# Patient Record
Sex: Male | Born: 1975 | Race: White | Hispanic: No | Marital: Married | Smoking: Never smoker
Health system: Southern US, Community
[De-identification: ages and names within clinical notes are randomized; demographics above are authoritative.]

## PROBLEM LIST (undated history)

## (undated) DIAGNOSIS — N289 Disorder of kidney and ureter, unspecified: Secondary | ICD-10-CM

---

## 2009-11-06 HISTORY — PX: KIDNEY TRANSPLANT: SHX239

## 2019-08-18 ENCOUNTER — Other Ambulatory Visit: Payer: Self-pay

## 2019-08-18 ENCOUNTER — Encounter (HOSPITAL_COMMUNITY): Payer: Self-pay | Admitting: Emergency Medicine

## 2019-08-18 ENCOUNTER — Emergency Department (HOSPITAL_COMMUNITY)
Admission: EM | Admit: 2019-08-18 | Discharge: 2019-08-19 | Disposition: A | Discharge status: Home / Self Care | Payer: BC Managed Care – PPO | Attending: Emergency Medicine | Admitting: Emergency Medicine

## 2019-08-18 ENCOUNTER — Emergency Department (HOSPITAL_COMMUNITY): Payer: BC Managed Care – PPO

## 2019-08-18 DIAGNOSIS — M25561 Pain in right knee: Secondary | ICD-10-CM

## 2019-08-18 DIAGNOSIS — M25461 Effusion, right knee: Secondary | ICD-10-CM

## 2019-08-18 HISTORY — DX: Disorder of kidney and ureter, unspecified: N28.9

## 2019-08-18 NOTE — ED Triage Notes (Signed)
Pt c/o right knee pain that started last night. Denies fall/trauma. Pt states he was seen for gout to the right foot by PCP yesterday.

## 2019-08-19 MED ORDER — HYDROCODONE-ACETAMINOPHEN 5-325 MG PO TABS: 1.0000 | ORAL_TABLET | Freq: Four times a day (QID) | ORAL | 0 refills | Status: AC | PRN

## 2019-08-19 MED ORDER — DICLOFENAC SODIUM 1 % TD GEL: 4.0000 g | Freq: Four times a day (QID) | TRANSDERMAL | 1 refills | Status: AC

## 2019-08-19 NOTE — ED Notes (Signed)
PT alert and oriented, one touch pt, see provider's assessment.

## 2019-08-19 NOTE — Discharge Instructions (Addendum)
You have been seen today for knee pain and swelling. There was swelling and fluid noted on the xray.  Diclofenac gel: This is a topical anti-inflammatory medication and can be applied directly to the painful region.  Do not use on the face or genitals.  This medication may be used as an alternative to oral anti-inflammatory medications, such as ibuprofen or naproxen. Acetaminophen: May take acetaminophen (generic for Tylenol), as needed, for pain. Your daily total maximum amount of acetaminophen from all sources should be limited to 4000mg /day for persons without liver problems, or 2000mg /day for those with liver problems. Vicodin: May take Vicodin (hydrocodone-acetaminophen) as needed for severe pain.   Do not drive or perform other dangerous activities while taking this medication as it can cause drowsiness as well as changes in reaction time and judgement.   Please note that each pill of Vicodin contains 325 mg of acetaminophen (generic for Tylenol) and the above dosage limits apply. Ice: May apply ice to the area over the next 24 hours for 15 minutes at a time to reduce swelling. Elevation: Keep the extremity elevated as often as possible to reduce pain and inflammation. Support: Wear the knee sleeve for support and comfort. Wear this until pain resolves. You will be weight-bearing as tolerated, which means you can slowly start to put weight on the extremity and increase amount and frequency as pain allows. Follow up: If symptoms are improving, you may follow up with your primary care provider for any continued management. If symptoms are not starting to improve within a week, you should follow up with the orthopedic specialist within two weeks. Return: Return to the ED for fever, numbness, weakness, increasing pain, overall worsening symptoms, loss of function, or if symptoms are not improving, you have tried to follow up with the orthopedic specialist, and have been unable to do so.  For  prescription assistance, may try using prescription discount sites or apps, such as goodrx.com

## 2019-08-19 NOTE — ED Provider Notes (Signed)
[facility] Provider Note   CSN: [id number] Arrival date & time: 08/18/19  1918     History   Chief Complaint Chief Complaint  Patient presents with  . Knee Pain    HPI Marc Mclaughlin is a 43 y.o. male.     HPI   Marc Mclaughlin is a 43 y.o. male, with a history of kidney transplant, presenting to the ED with right knee pain and swelling beginning evening of October 11.  Patient states he was sitting in a recliner throughout the day.  When he got up to move around, he noted pain had developed in the right knee associated with swelling.  He has not had this issue before. Patient states he was seen by his PCP last week for pain in the left toes, clinically diagnosed with gout, and prescribed colchicine.  No previous history of gout before this. Denies history of any recent procedures and has never had a procedure to the right knee. Denies fever/chills, color change, falls/trauma, numbness, weakness, calf pain/edema, shortness of breath, chest pain, or any other complaints.     Past Medical History:  Diagnosis Date  . Renal disorder     There are no active problems to display for this patient.   Past Surgical History:  Procedure Laterality Date  . KIDNEY TRANSPLANT  2011        Home Medications    Prior to Admission medications   Medication Sig Start Date End Date Taking? Authorizing Provider  diclofenac sodium (VOLTAREN) 1 % GEL Apply 4 g topically 4 (four) times daily. 08/19/19   Lucero Auzenne C, PA-C  HYDROcodone-acetaminophen (NORCO/VICODIN) 5-325 MG tablet Take 1-2 tablets by mouth every 6 (six) hours as needed for severe pain. 08/19/19   Clyde Zarrella, Hillard DankerShawn C, PA-C    Family History No family history on file.  Social History Social History   Tobacco Use  . Smoking status: Never Smoker  . Smokeless tobacco: Never Used  Substance Use Topics  . Alcohol use: Yes  . Drug use: Never     Allergies   Sulfa antibiotics and  Sulfamethoxazole   Review of Systems Review of Systems  Constitutional: Negative for fever.  Respiratory: Negative for shortness of breath.   Cardiovascular: Negative for chest pain.  Musculoskeletal: Positive for arthralgias and joint swelling.  Neurological: Negative for weakness and numbness.     Physical Exam Updated Vital Signs BP 138/84 (BP Location: Right Arm)   Pulse 88   Temp 99.2 F (37.3 C) (Oral)   Resp 18   SpO2 100%   Physical Exam Vitals signs and nursing note reviewed.  Constitutional:      General: He is not in acute distress.    Appearance: He is well-developed. He is not diaphoretic.  HENT:     Head: Normocephalic and atraumatic.  Eyes:     Conjunctiva/sclera: Conjunctivae normal.  Neck:     Musculoskeletal: Neck supple.  Cardiovascular:     Rate and Rhythm: Normal rate and regular rhythm.     Pulses:          Dorsalis pedis pulses are 2+ on the right side and 2+ on the left side.       Posterior tibial pulses are 2+ on the right side and 2+ on the left side.  Pulmonary:     Effort: Pulmonary effort is normal.  Musculoskeletal:     Comments: Swelling suggesting effusion that seems to be centered in the right suprapatellar region.  There is associated tenderness suprapatellar and medial. Patient is able to demonstrate active range of motion, though it is limited at about 90 degrees of flexion with patient stating he is limited due to the swelling in his knee. No increased warmth, erythema/color change, instability, laxity, or deformity noted in the right knee.  No tenderness, swelling, color change, or increased warmth to the right upper leg, posterior knee, calf, ankle, or foot.    Skin:    General: Skin is warm and dry.     Coloration: Skin is not pale.  Neurological:     Mental Status: He is alert.     Comments: Sensation to light touch grossly intact in the right lower extremity. Strength 5/5 in the right foot, knee, and hip.  Psychiatric:         Behavior: Behavior normal.      ED Treatments / Results  Labs (all labs ordered are listed, but only abnormal results are displayed) Labs Reviewed - No data to display  EKG None  Radiology Dg Knee Complete 4 Views Right  Result Date: 08/18/2019 CLINICAL DATA:  Right anterior patellar pain. EXAM: RIGHT KNEE - COMPLETE 4+ VIEW COMPARISON:  None. FINDINGS: No evidence of fracture, or dislocation. Large suprapatellar joint effusion. No evidence of arthropathy or other focal bone abnormality. Soft tissues are unremarkable. IMPRESSION: 1. Large suprapatellar joint effusion. 2. No radiographically apparent osseous abnormality. Electronically Signed   By: Ted Mcalpine M.D.   On: 08/18/2019 20:08    Procedures Procedures (including critical care time)  Medications Ordered in ED Medications - No data to display   Initial Impression / Assessment and Plan / ED Course  I have reviewed the triage vital signs and the nursing notes.  Pertinent labs & imaging results that were available during my care of the patient were reviewed by me and considered in my medical decision making (see chart for details).        Patient presents with knee pain and swelling. Patient is nontoxic appearing, afebrile, not tachycardic, not tachypneic, not hypotensive, maintains excellent SPO2 on room air.  His initial vital signs indicated some mild tachycardia, however, this resolved by the time I saw the patient.  I suspect the initial tachycardia may have been due to pain. X-ray shows suprapatellar effusion.  My suspicion for septic joint is low based on timeline and physical exam and vital signs are not suggestive. Discussed therapeutic arthrocentesis.  Patient declined. The patient was given instructions for home care as well as return precautions. Patient voices understanding of these instructions, accepts the plan, and is comfortable with discharge.   Findings and plan of care discussed with  Dione Booze, MD.    Vitals:   08/18/19 1927 08/18/19 1930 08/18/19 2311 08/19/19 0148  BP: (!) 145/71  138/84 (!) 157/86  Pulse: (!) 106  88 93  Resp: 18  18 14   Temp:  98.6 F (37 C) 99.2 F (37.3 C)   TempSrc:  Oral Oral   SpO2: 98%  100% 97%     Final Clinical Impressions(s) / ED Diagnoses   Final diagnoses:  Effusion of right knee  Acute pain of right knee    ED Discharge Orders         Ordered    HYDROcodone-acetaminophen (NORCO/VICODIN) 5-325 MG tablet  Every 6 hours PRN     08/19/19 0107    diclofenac sodium (VOLTAREN) 1 % GEL  4 times daily     08/19/19 0109  Lorayne Bender, PA-C 88/91/69 [id number]    Delora Fuel, MD 88/82/80 [phone]

## 2019-09-17 ENCOUNTER — Other Ambulatory Visit: Payer: Self-pay | Admitting: Orthopedic Surgery

## 2019-09-17 DIAGNOSIS — M25561 Pain in right knee: Secondary | ICD-10-CM

## 2019-10-09 ENCOUNTER — Other Ambulatory Visit: Payer: Self-pay

## 2019-10-09 ENCOUNTER — Ambulatory Visit
Admission: RE | Admit: 2019-10-09 | Discharge: 2019-10-09 | Disposition: A | Discharge status: Home / Self Care | Payer: BC Managed Care – PPO | Source: Ambulatory Visit | Attending: Orthopedic Surgery | Admitting: Orthopedic Surgery

## 2019-10-09 DIAGNOSIS — M25561 Pain in right knee: Secondary | ICD-10-CM

## 2021-09-26 IMAGING — MR MR KNEE*R* W/O CM
4 of 6 series · 21 of 40 positions shown · non-contrast
Comparison: Radiographs 08/18/2019.

CLINICAL DATA: Right knee pain and swelling for a few weeks. Some
weakness. No acute injury or prior relevant surgery.

EXAM:
MRI OF THE RIGHT KNEE WITHOUT CONTRAST
TECHNIQUE: Multiplanar, multisequence MR imaging of the knee was performed. No
intravenous contrast was administered.

[Series 3: T2 fat-sat · axial · 4.0mm · 0.50mm/px · z∈[-48,+71]mm · 3 of 30 slices shown (1 of 2)]
[im 6/30]
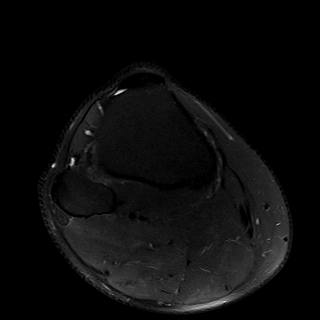
[im 18/30]
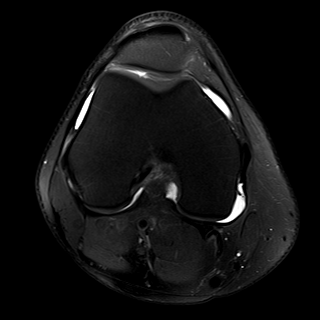
[im 30/30]
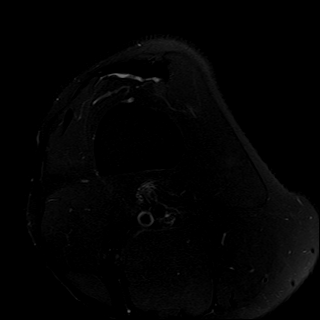

[Series 5: T2 fat-sat · coronal · 4.0mm · 0.31mm/px · 3 of 28 slices shown (2 of 2)]
[im 6/28]
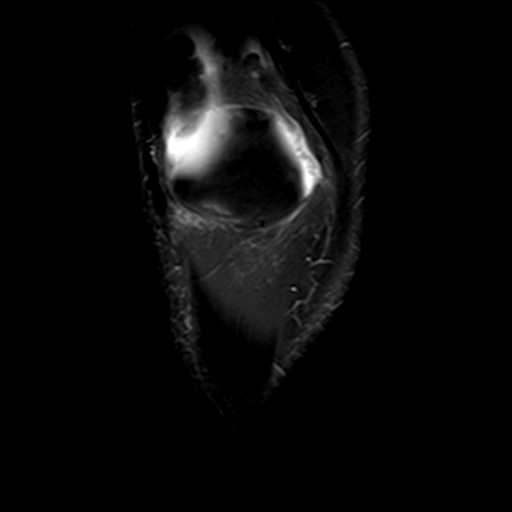
[im 17/28]
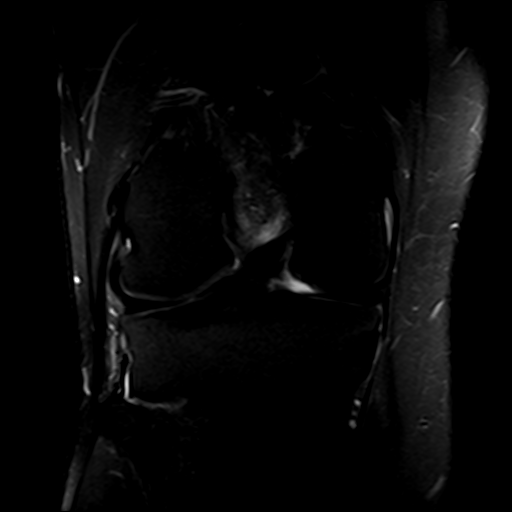
[im 28/28]
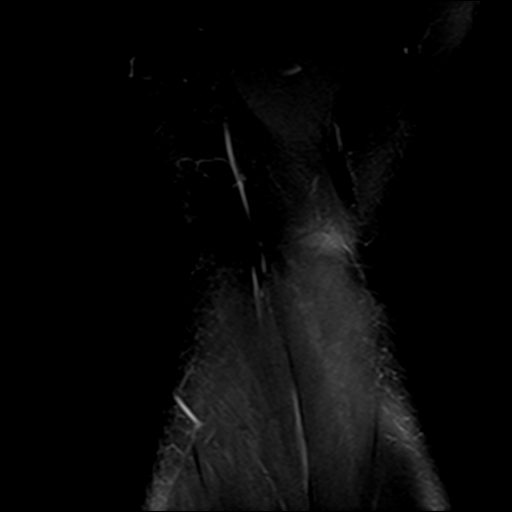

[Series 6: PD fat-sat · coronal · 3.0mm · 0.31mm/px · 8 of 35 slices shown (1 of 2)]
[im 1/35]
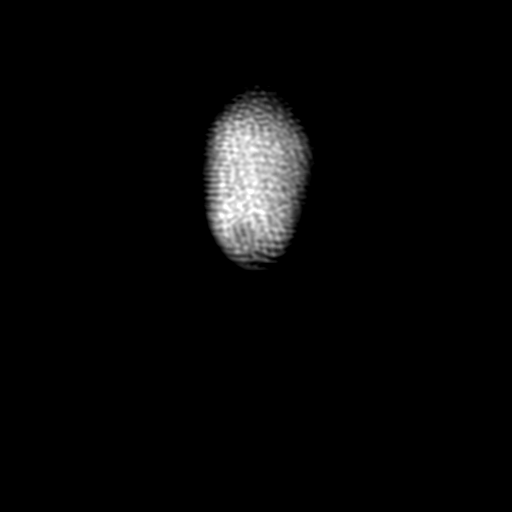
[im 5/35]
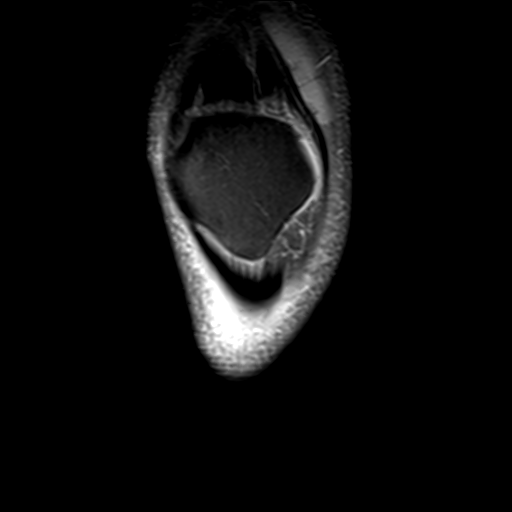
[im 10/35]
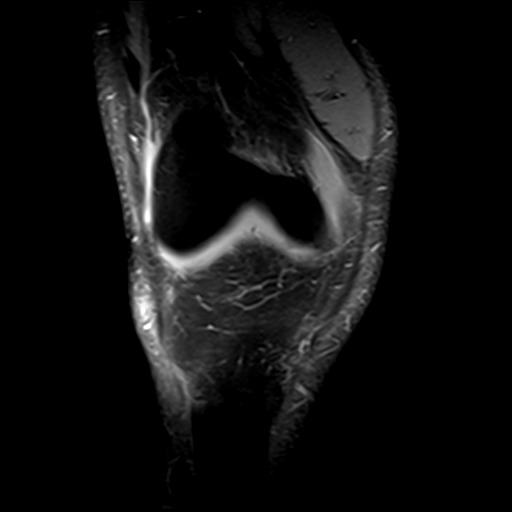
[im 15/35]
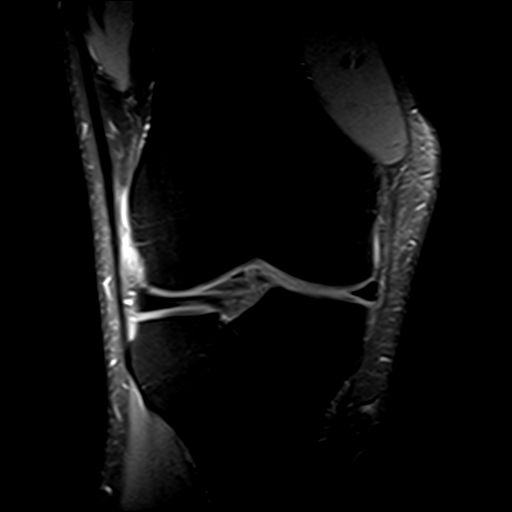
[im 20/35]
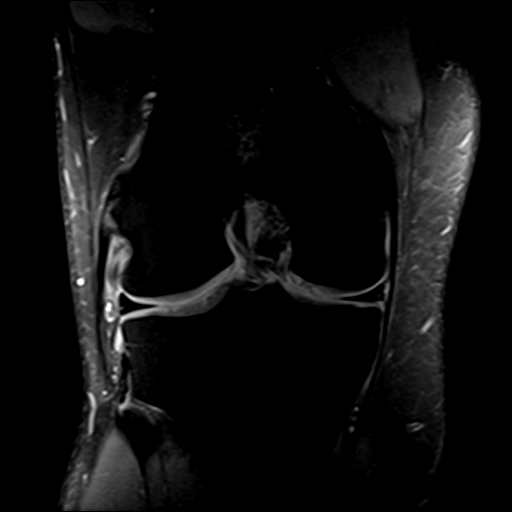
[im 25/35]
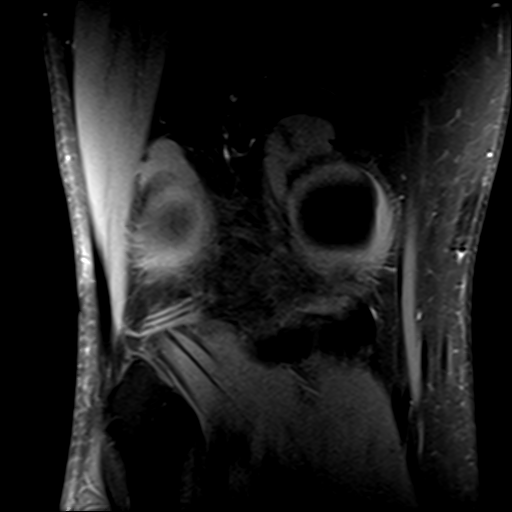
[im 30/35]
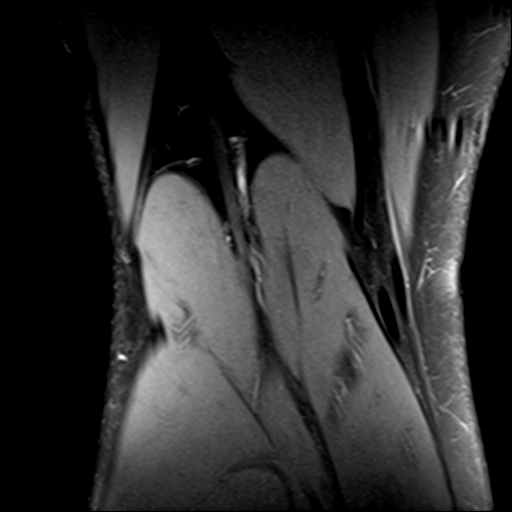
[im 35/35]
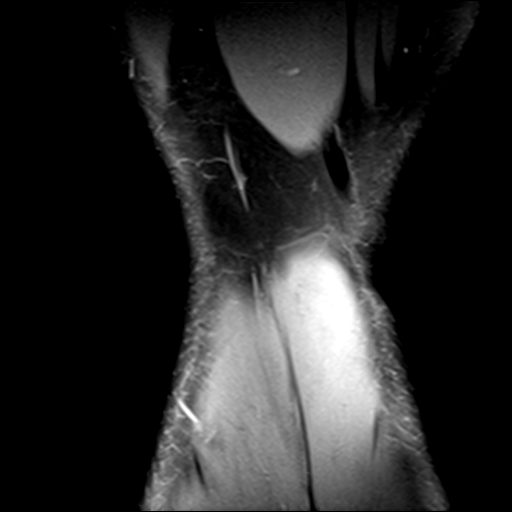

[Series 8: PD fat-sat · sagittal · 3.0mm · 0.31mm/px · 7 of 31 slices shown (2 of 2)]
[im 1/31]
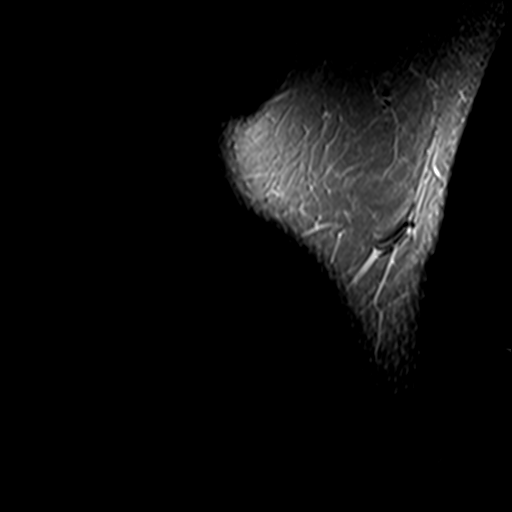
[im 6/31]
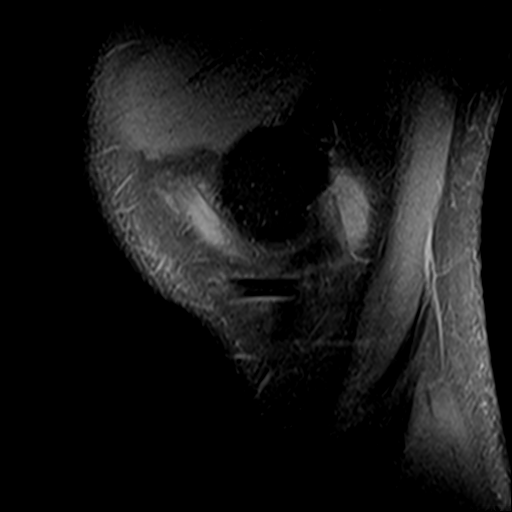
[im 11/31]
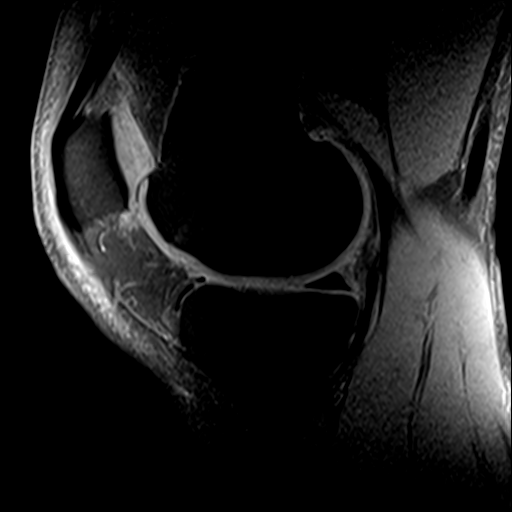
[im 16/31]
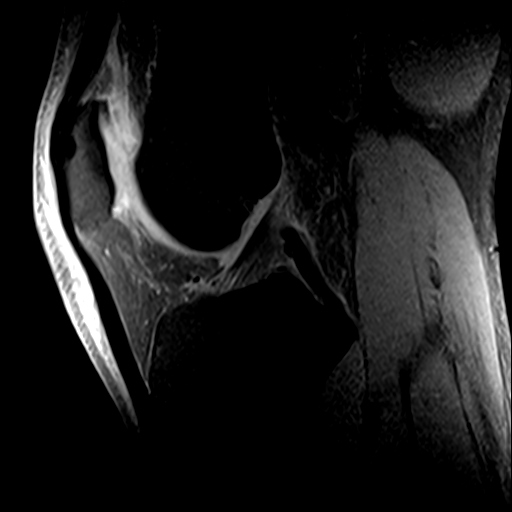
[im 21/31]
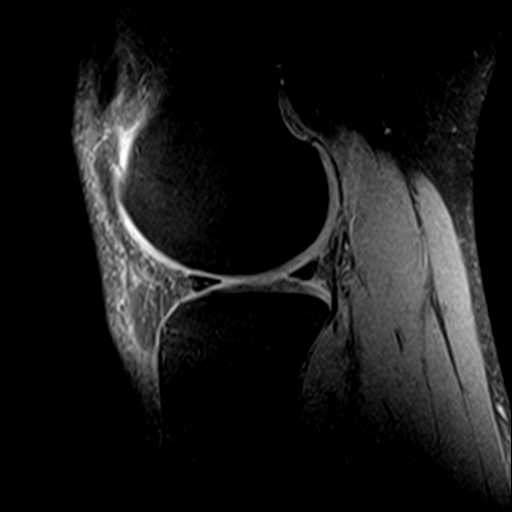
[im 26/31]
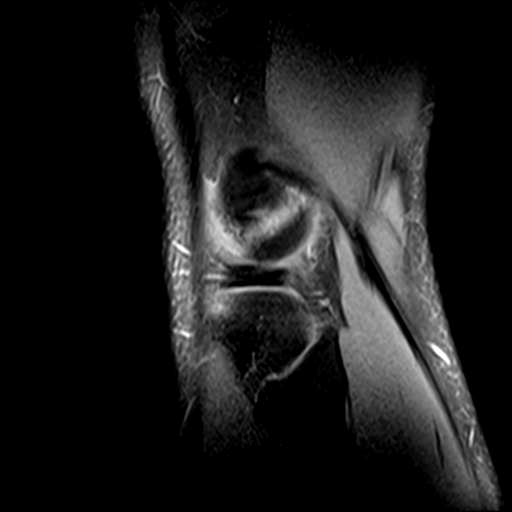
[im 31/31]
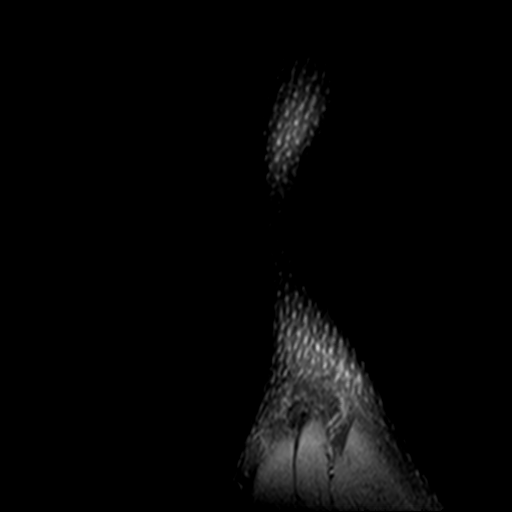

[21 of 40 positions shown; findings below may reference images not displayed]

FINDINGS: MENISCI

Medial meniscus:  Intact with normal morphology.

Lateral meniscus:  Intact with normal morphology.

LIGAMENTS

Cruciates:  Intact.

Collaterals:  Intact.

CARTILAGE

Patellofemoral: The patellar cartilage is preserved. There is
irregular thinning the medial trochlear cartilage with associated
subchondral cyst formation.

Medial:  Minimal chondral thinning without focal defect.

Lateral:  Preserved.

MISCELLANEOUS

Joint:  Small joint effusion.

Popliteal Fossa:  Unremarkable. No significant Baker's cyst.

Extensor Mechanism:  Intact.

Bones: No acute or significant extra-articular osseous findings.
There is a small bone island in the patella.

Other: Mild prepatellar subcutaneous edema.
IMPRESSION: 1. No acute findings or evidence of internal derangement. The
menisci, cruciate and collateral ligaments are intact.
2. Focal thinning of the medial trochlear cartilage with associated
subchondral cyst formation.
3. Small joint effusion.
